# Patient Record
Sex: Female | Born: 1980 | Race: White | Hispanic: No | Marital: Married | Smoking: Never smoker
Health system: Southern US, Community
[De-identification: ages and names within clinical notes are randomized; demographics above are authoritative.]

## PROBLEM LIST (undated history)

## (undated) DIAGNOSIS — D649 Anemia, unspecified: Secondary | ICD-10-CM

## (undated) HISTORY — DX: Anemia, unspecified: D64.9

## (undated) HISTORY — PX: WISDOM TOOTH EXTRACTION: SHX21

---

## 2008-02-03 ENCOUNTER — Emergency Department: Payer: Self-pay | Admitting: Emergency Medicine

## 2009-08-31 LAB — CONVERTED CEMR LAB: Pap Smear: NORMAL

## 2009-11-11 ENCOUNTER — Ambulatory Visit: Payer: Self-pay | Admitting: Internal Medicine

## 2009-11-11 DIAGNOSIS — D509 Iron deficiency anemia, unspecified: Secondary | ICD-10-CM

## 2009-11-11 DIAGNOSIS — R002 Palpitations: Secondary | ICD-10-CM

## 2010-05-15 ENCOUNTER — Encounter: Admission: RE | Admit: 2010-05-15 | Discharge: 2010-05-15 | Payer: Self-pay | Admitting: Internal Medicine

## 2010-09-30 NOTE — Assessment & Plan Note (Signed)
Summary: NEW PT/BCBS/HEART MURMUR?/#/LB   Vital Signs:  Patient profile:   30 year old female Height:      61 inches Weight:      131.50 pounds BMI:     24.94 O2 Sat:      98 % on Room air Temp:     97.2 degrees F oral Pulse rate:   81 / minute BP sitting:   116 / 80  (left arm) Cuff size:   regular  Vitals Entered ByZella Ball Ewing (November 11, 2009 9:40 AM)  O2 Flow:  Room air  Preventive Care Screening  Pap Smear:    Date:  08/31/2009    Results:  normal      declines tetanus shot  CC: New Pt/RE   CC:  New Pt/RE.  History of Present Illness: here with "heart fluttering off and on"  more lately in the 3 months;;  Pt denies CP,, doe, wheezing, orthopnea, pnd, worsening LE edema,  dizziness or syncope, but ":take my breath away and startles me"   Symptoms for last for up to 2 seconds and seems hard, but more frequent lately, with more social stressors.  Denies signifcinat depression, suicdal ideaion, or panic or anxiety.  has caffeine once per wk  only - usually water.   No recentt thyroid or hgb . The past 3 days with 1 -3 times per day, random  .  no prior hx of tx,  on prior hx of diagnosis of pvc or other.  No previous heart issues or murmur.    Preventive Screening-Counseling & Management  Alcohol-Tobacco     Smoking Status: never      Drug Use:  no.    Problems Prior to Update: None  Medications Prior to Update: 1)  None  Current Medications (verified): 1)  Atenolol 25 Mg Tabs (Atenolol) .... 1/2 - 1 By Mouth Once Daily As Needed  Allergies (verified): No Known Drug Allergies  Past History:  Family History: Last updated: 11/11/2009 father with HTN, and elevated cholesterol, MI grandfather with MI/CAD grandfather with stroke, and HTN p-aunt and uncle with DM mother with thyroid surgury  Social History: Last updated: 11/11/2009 work - Sales executive Single no children Alcohol use-yes - social Never Smoked Drug use-no  Risk Factors: Smoking  Status: never (11/11/2009)  Past Medical History: Anemia-iron deficiency  Past Surgical History: Denies surgical history  Family History: Reviewed history and no changes required. father with HTN, and elevated cholesterol, MI grandfather with MI/CAD grandfather with stroke, and HTN p-aunt and uncle with DM mother with thyroid surgury  Social History: Reviewed history and no changes required. work - Sales executive Single no children Alcohol use-yes - social Never Smoked Drug use-no Smoking Status:  never Drug Use:  no  Review of Systems  The patient denies anorexia, fever, weight loss, weight gain, vision loss, decreased hearing, hoarseness, chest pain, syncope, dyspnea on exertion, peripheral edema, prolonged cough, headaches, hemoptysis, abdominal pain, melena, hematochezia, severe indigestion/heartburn, hematuria, muscle weakness, suspicious skin lesions, difficulty walking, depression, unusual weight change, abnormal bleeding, enlarged lymph nodes, and angioedema.         all otherwise negative per pt -    Physical Exam  General:  alert and well-developed.   Head:  normocephalic and atraumatic.   Eyes:  vision grossly intact, pupils equal, and pupils round.   Ears:  R ear normal and L ear normal.   Nose:  no external deformity and no nasal discharge.   Mouth:  no  gingival abnormalities and pharynx pink and moist.   Neck:  supple and no masses.   Lungs:  normal respiratory effort and normal breath sounds.   Heart:  normal rate, regular rhythm, no murmur, and no gallop.   Abdomen:  soft, non-tender, and normal bowel sounds.   Msk:  no joint tenderness and no joint swelling.   Extremities:  no edema, no erythema  Neurologic:  cranial nerves II-XII intact and strength normal in all extremities.   Skin:  color normal and no rashes.   Psych:  not depressed appearing and slightly anxious.     Impression & Recommendations:  Problem # 1:  Preventive Health Care  (ICD-V70.0)  Overall doing well, age appropriate education and counseling updated and referral for appropriate preventive services done unless declined, immunizations up to date or declined, diet counseling done if overweight, urged to quit smoking if smokes , most recent labs reviewed and current ordered if appropriate, ecg reviewed or declined (interpretation per ECG scanned in the EMR if done); information regarding Medicare Prevention requirements given if appropriate   Orders: TLB-BMP (Basic Metabolic Panel-BMET) (80048-METABOL) TLB-CBC Platelet - w/Differential (85025-CBCD) TLB-Hepatic/Liver Function Pnl (80076-HEPATIC) TLB-Lipid Panel (80061-LIPID) TLB-TSH (Thyroid Stimulating Hormone) (84443-TSH) TLB-Udip ONLY (81003-UDIP)  Problem # 2:  PALPITATIONS, OCCASIONAL (ICD-785.1)  prob stress related, consider holter but so infrequent this  is likely low yield;  ok for atenolol as needed, to check echo - r/o MVP, avoid caffeine and stress if possible  Orders: EKG w/ Interpretation (93000) Echo Referral (Echo)  Her updated medication list for this problem includes:    Atenolol 25 Mg Tabs (Atenolol) .Marland Kitchen... 1/2 - 1 by mouth once daily as needed  Complete Medication List: 1)  Atenolol 25 Mg Tabs (Atenolol) .... 1/2 - 1 by mouth once daily as needed  Patient Instructions: 1)  Please go to the Lab in the basement for your blood and/or urine tests today  2)  Pleaes call the number on the [facility] Card for results of your tests 3)  Your EKG was good today 4)  You will be contacted about the referral(s) to: echocardiogram 5)  We can consider the Holter Monitor if your symptoms persist or get worse 6)  Please take all new medications as prescribed as needed 7)  Please schedule a follow-up appointment as needed. Prescriptions: ATENOLOL 25 MG TABS (ATENOLOL) 1/2 - 1 by mouth once daily as needed  #30 x 11   Entered and Authorized by:   Corwin Levins MD   Signed by:   Corwin Levins MD on  11/11/2009   Method used:   Print then Give to Patient   RxID:   [id number]

## 2011-11-05 ENCOUNTER — Encounter: Payer: Self-pay | Admitting: *Deleted

## 2011-11-05 ENCOUNTER — Ambulatory Visit (INDEPENDENT_AMBULATORY_CARE_PROVIDER_SITE_OTHER): Payer: BC Managed Care – PPO | Admitting: Internal Medicine

## 2011-11-05 ENCOUNTER — Encounter: Payer: Self-pay | Admitting: Internal Medicine

## 2011-11-05 VITALS — BP 150/79 | HR 88 | Temp 98.4°F | Resp 16 | Ht 62.0 in | Wt 146.6 lb

## 2011-11-05 DIAGNOSIS — J069 Acute upper respiratory infection, unspecified: Secondary | ICD-10-CM

## 2011-11-05 MED ORDER — MOMETASONE FUROATE 50 MCG/ACT NA SUSP: 2.0000 | Freq: Every day | NASAL | Status: DC

## 2011-11-05 MED ORDER — PREDNISONE 20 MG PO TABS: ORAL_TABLET | ORAL | Status: DC

## 2011-11-05 NOTE — Progress Notes (Signed)
  Subjective:    Patient ID: Shelby Finley, female    DOB: 1981-05-23, 31 y.o.   MRN: [id number]  URI  This is a new problem. The current episode started in the past 7 days. The problem has been unchanged. There has been no fever. Associated symptoms include congestion and rhinorrhea. Pertinent negatives include no coughing, ear pain, nausea, neck pain, rash, swollen glands or wheezing. She has tried decongestant for the symptoms. The treatment provided no relief.   Siobhan has had rhinitis for 2 days, has difficulty breathing through her nose.  She had similar symptoms 2 weeks ago but it resolved completely.  No asthma or allergies in her background, she has never had PE tubes or recurrent sinus problems.  She is recently married, uses birth control, last period 10 days ago, she denies pregnancy.   Review of Systems  Constitutional: Negative.   HENT: Positive for congestion and rhinorrhea. Negative for ear pain, neck pain and postnasal drip.   Eyes: Negative.   Respiratory: Negative.  Negative for cough and wheezing.   Cardiovascular: Negative.   Gastrointestinal: Negative.  Negative for nausea.  Genitourinary: Negative.   Musculoskeletal: Negative.   Skin: Negative for rash.  Psychiatric/Behavioral: Negative.   All other systems reviewed and are negative.       Objective:   Physical Exam  Vitals reviewed. Constitutional: She is oriented to person, place, and time. She appears well-developed and well-nourished.  HENT:  Head: Normocephalic and atraumatic.  Left Ear: External ear normal.  Mouth/Throat: Oropharynx is clear and moist. No oropharyngeal exudate.       Right TM only partially observed secondary to cerumen  Eyes: Conjunctivae are normal.  Neck: Neck supple.  Cardiovascular: Normal rate, regular rhythm and normal heart sounds.   Pulmonary/Chest: Effort normal and breath sounds normal. She has no wheezes.  Abdominal: Soft.  Lymphadenopathy:    She has no cervical  adenopathy.  Neurological: She is alert and oriented to person, place, and time.  Skin: Skin is warm and dry.  Psychiatric: She has a normal mood and affect. Her behavior is normal.          Assessment & Plan:  URI:  She is given a short course of prednisone and Nasonex for her symptoms.  If her sinus discomfort increases she is to call me and I will call her in an antibiotic for sinusitis.  She agrees with this plan.  AVS printed.

## 2011-11-05 NOTE — Patient Instructions (Signed)

## 2012-10-17 ENCOUNTER — Ambulatory Visit (INDEPENDENT_AMBULATORY_CARE_PROVIDER_SITE_OTHER): Payer: BC Managed Care – PPO | Admitting: Internal Medicine

## 2012-10-17 VITALS — BP 121/71 | HR 91 | Temp 98.3°F | Resp 16 | Ht 62.0 in | Wt 143.4 lb

## 2012-10-17 DIAGNOSIS — H9201 Otalgia, right ear: Secondary | ICD-10-CM

## 2012-10-17 DIAGNOSIS — H9209 Otalgia, unspecified ear: Secondary | ICD-10-CM

## 2012-10-17 MED ORDER — NEOMYCIN-POLYMYXIN-HC 3.5-10000-1 OT SUSP: 3.0000 [drp] | Freq: Three times a day (TID) | OTIC | Status: DC

## 2012-10-17 NOTE — Progress Notes (Signed)
  Subjective:    Patient ID: Shelby Finley, female    DOB: 05/08/1981, 32 y.o.   MRN: [id number]  HPI healthy 32 year old with a two-day history of pain in the right ear No change in hearing Pain is sharp and intermittent No recent URI or cough No chronic ear problems    Review of Systems     Objective:   Physical Exam Vital signs stable Right canal with partial occlusion by cerumen Left canal clear Nares clear No regional adenopathy No tenderness with tragus pull  Right ear irrigated successfully Post irrigation the canal Canal is clear but mildly inflamed/no cellulitis/tympanic membrane clear       Assessment & Plan:  Problem #1 otalgia  Cortisporin Otic for 7 days

## 2013-01-17 ENCOUNTER — Ambulatory Visit: Payer: BC Managed Care – PPO

## 2013-01-17 ENCOUNTER — Ambulatory Visit (INDEPENDENT_AMBULATORY_CARE_PROVIDER_SITE_OTHER): Payer: BC Managed Care – PPO | Admitting: Family Medicine

## 2013-01-17 VITALS — BP 132/64 | HR 115 | Temp 98.5°F | Resp 16 | Ht 62.5 in | Wt 145.4 lb

## 2013-01-17 DIAGNOSIS — R079 Chest pain, unspecified: Secondary | ICD-10-CM

## 2013-01-17 DIAGNOSIS — R002 Palpitations: Secondary | ICD-10-CM

## 2013-01-17 LAB — COMPREHENSIVE METABOLIC PANEL
ALT: 16 U/L (ref 0–35)
AST: 18 U/L (ref 0–37)
Alkaline Phosphatase: 41 U/L (ref 39–117)
BUN: 12 mg/dL (ref 6–23)
Creat: 0.91 mg/dL (ref 0.50–1.10)
Potassium: 4.8 mEq/L (ref 3.5–5.3)

## 2013-01-17 LAB — POCT CBC
Granulocyte percent: 59.5 %G (ref 37–80)
MCH, POC: 29.8 pg (ref 27–31.2)
MCV: 95.9 fL (ref 80–97)
MID (cbc): 0.4 (ref 0–0.9)
MPV: 8.7 fL (ref 0–99.8)
POC LYMPH PERCENT: 33.2 %L (ref 10–50)
POC MID %: 7.3 %M (ref 0–12)
Platelet Count, POC: 290 10*3/uL (ref 142–424)
RBC: 4.29 M/uL (ref 4.04–5.48)
RDW, POC: 14.3 %

## 2013-01-17 LAB — LIPID PANEL
HDL: 77 mg/dL (ref >39)
LDL Cholesterol: 49 mg/dL (ref 0–99)
Triglycerides: 128 mg/dL (ref <150)
VLDL: 26 mg/dL (ref 0–40)

## 2013-01-17 NOTE — Progress Notes (Addendum)
Subjective:    Patient ID: Shelby Finley, female    DOB: May 30, 1981, 32 y.o.   MRN: [id number] Chief Complaint  Patient presents with  . Chest Pain    been having chest tightness and irregular heart rate for awhile    HPI  Has been having intermittent chest tightness for the past sev mos.  Sometimes chest tightness makes her feel like she needs to take a deep breath. Before would only have episodes about twice a year but within the past few mos more reg than last wk was daily - episodes consist of palpitations, sometimes tightness alone.  Palpitations occur only over seconds but tightness can last hours. No indigestion or heartburn no metallic saliva or water brash. Occ caffiene - very rare.   Drinking a lot water. Never checks pulse or BP. Used to get a lot of exercise but past few mos has quit - was taking pre-work-out supp and though that might be triggering sxs but stopped that w/o relief.  Palpitations and tightness not related to exertion Does not sleep very well - does not feel rested when she wakes up - gets about 7 hrs.   Got married 2 yrs ago High stress levels due to job but no anxiety or h/o panic attacks but work very constant.  The episodes of chest tightness and palpitations didn't happen when she was on vacations recently at all but just seem to be more at work.    No h/o asthma.  No meds or current supp other than birth ctonrol. Calves swell occ in but always equal in each leg and occ. Father sees Dr. Lorella Nimrod w/ [facility] so was hoping to get a cardiology eval with him but was told that she needed a referral. Lots of +FHx of CAD in father'd side - father MI in 27s, PGM in 85s, PGF MI - all w/ HTN MOther w/ thyroid and thyroid, and MGM w/ CVA and HTN - 61s  Works in a [employer] development officer.  Past Medical History  Diagnosis Date  . Anemia    History reviewed. No pertinent past surgical history. Current Outpatient Prescriptions on File Prior to Visit  Medication Sig  Dispense Refill  . norethindrone-ethinyl estradiol (JUNEL FE,GILDESS FE,LOESTRIN FE) 1-20 MG-MCG tablet Take 1 tablet by mouth daily.       No current facility-administered medications on file prior to visit.   No Known Allergies Family History  Problem Relation Age of Onset  . Heart disease Father   . Hypertension Father   . Hypertension Maternal Grandmother   . Stroke Maternal Grandmother   . Hypertension Maternal Grandfather   . Hypertension Paternal Grandmother   . Heart disease Paternal Grandmother    History   Social History  . Marital Status: Married    Spouse Name: N/A    Number of Children: N/A  . Years of Education: N/A   Social History Main Topics  . Smoking status: Never Smoker   . Smokeless tobacco: None  . Alcohol Use: Yes  . Drug Use: No  . Sexually Active: Yes    Birth Control/ Protection: Pill   Other Topics Concern  . None   Social History Narrative  . None    Review of Systems  Constitutional: Positive for fatigue. Negative for fever, chills, diaphoresis and appetite change.  HENT: Negative for sore throat, drooling, mouth sores, trouble swallowing and voice change.   Eyes: Negative for visual disturbance.  Respiratory: Positive for chest tightness. Negative for cough and  shortness of breath.   Cardiovascular: Positive for palpitations and leg swelling. Negative for chest pain.  Gastrointestinal: Negative for abdominal pain.  Genitourinary: Negative for decreased urine volume.  Neurological: Negative for syncope and headaches.  Hematological: Does not bruise/bleed easily.      BP 132/64  Pulse 115  Temp(Src) 98.5 F (36.9 C) (Oral)  Resp 16  Ht 5' 2.5" (1.588 m)  Wt 145 lb 6.4 oz (65.953 kg)  BMI 26.15 kg/m2  SpO2 97%  LMP 01/16/2013 - recheck pulse 80s Objective:   Physical Exam  Constitutional: She is oriented to person, place, and time. She appears well-developed and well-nourished. No distress.  HENT:  Head: Normocephalic and  atraumatic.  Right Ear: External ear normal.  Left Ear: External ear normal.  Eyes: Conjunctivae are normal. No scleral icterus.  Neck: Normal range of motion. Neck supple. No thyromegaly present.  Cardiovascular: Normal rate, regular rhythm, normal heart sounds and intact distal pulses.   Pulmonary/Chest: Effort normal and breath sounds normal. No respiratory distress.  Musculoskeletal: She exhibits no edema.  Lymphadenopathy:    She has no cervical adenopathy.  Neurological: She is alert and oriented to person, place, and time.  Skin: Skin is warm and dry. She is not diaphoretic. No erythema.  Psychiatric: She has a normal mood and affect. Her behavior is normal.      Results for orders placed in visit on 01/17/13  POCT CBC      Result Value Range   WBC 5.7  4.6 - 10.2 K/uL   Lymph, poc 1.9  0.6 - 3.4   POC LYMPH PERCENT 33.2  10 - 50 %L   MID (cbc) 0.4  0 - 0.9   POC MID % 7.3  0 - 12 %M   POC Granulocyte 3.4  2 - 6.9   Granulocyte percent 59.5  37 - 80 %G   RBC 4.29  4.04 - 5.48 M/uL   Hemoglobin 12.8  12.2 - 16.2 g/dL   HCT, POC 40.9  81.1 - 47.9 %   MCV 95.9  80 - 97 fL   MCH, POC 29.8  27 - 31.2 pg   MCHC 31.1 (*) 31.8 - 35.4 g/dL   RDW, POC 91.4     Platelet Count, POC 290  142 - 424 K/uL   MPV 8.7  0 - 99.8 fL      [facility] reading (PRIMARY) by  Dr. Clelia Croft. CXR: No abnormality. EKG: Pr int 112, NSR, no ischemic changes Assessment & Plan:  Chest pain, unspecified - Plan: EKG 12-Lead, POCT CBC, Comprehensive metabolic panel, TSH, Lipid panel, DG Chest 2 View  Palpitations - Will refer to cardiology for event monitor and further eval.  Shortened PR interval - likely benign but poss wpw abnml or other? - refer to cardiology

## 2013-01-17 NOTE — Patient Instructions (Addendum)
Palpitations  A palpitation is the feeling that your heartbeat is irregular or is faster than normal. It may feel like your heart is fluttering or skipping a beat. Palpitations are usually not a serious problem. However, in some cases, you may need further medical evaluation. CAUSES  Palpitations can be caused by:  Smoking.  Caffeine or other stimulants, such as diet pills or energy drinks.  Alcohol.  Stress and anxiety.  Strenuous physical activity.  Fatigue.  Certain medicines.  Heart disease, especially if you have a history of arrhythmias. This includes atrial fibrillation, atrial flutter, or supraventricular tachycardia.  An improperly working pacemaker or defibrillator. DIAGNOSIS  To find the cause of your palpitations, your caregiver will take your history and perform a physical exam. Tests may also be done, including:  Electrocardiography (ECG). This test records the heart's electrical activity.  Cardiac monitoring. This allows your caregiver to monitor your heart rate and rhythm in real time.  Holter monitor. This is a portable device that records your heartbeat and can help diagnose heart arrhythmias. It allows your caregiver to track your heart activity for several days, if needed.  Stress tests by exercise or by giving medicine that makes the heart beat faster. TREATMENT  Treatment of palpitations depends on the cause of your symptoms and can vary greatly. Most cases of palpitations do not require any treatment other than time, relaxation, and monitoring your symptoms. Other causes, such as atrial fibrillation, atrial flutter, or supraventricular tachycardia, usually require further treatment. HOME CARE INSTRUCTIONS   Avoid:  Caffeinated coffee, tea, soft drinks, diet pills, and energy drinks.  Chocolate.  Alcohol.  Stop smoking if you smoke.  Reduce your stress and anxiety. Things that can help you relax include:  A method that measures bodily functions so  you can learn to control them (biofeedback).  Yoga.  Meditation.  Physical activity such as swimming, jogging, or walking.  Get plenty of rest and sleep. SEEK MEDICAL CARE IF:   You continue to have a fast or irregular heartbeat beyond 24 hours.  Your palpitations occur more often. SEEK IMMEDIATE MEDICAL CARE IF:  You develop chest pain or shortness of breath.  You have a severe headache.  You feel dizzy, or you faint. MAKE SURE YOU:  Understand these instructions.  Will watch your condition.  Will get help right away if you are not doing well or get worse. Document Released: 08/14/2000 Document Revised: 02/16/2012 Document Reviewed: 10/16/2011 Exit[employer] Patient Information 2013 [employer].  

## 2013-06-12 ENCOUNTER — Ambulatory Visit (INDEPENDENT_AMBULATORY_CARE_PROVIDER_SITE_OTHER): Payer: BC Managed Care – PPO | Admitting: Family Medicine

## 2013-06-12 VITALS — BP 108/70 | HR 109 | Temp 98.6°F | Resp 18 | Ht 62.5 in | Wt 148.0 lb

## 2013-06-12 DIAGNOSIS — J329 Chronic sinusitis, unspecified: Secondary | ICD-10-CM

## 2013-06-12 MED ORDER — AMOXICILLIN 875 MG PO TABS: 875.0000 mg | ORAL_TABLET | Freq: Two times a day (BID) | ORAL | Status: DC

## 2013-06-12 NOTE — Progress Notes (Signed)
Subjective:   Patient ID: Shelby Finley, female    DOB: 05/28/1981, 32 y.o.   MRN: [id number] HPI Review of Systems Objective:   Physical Exam Assessment & Plan:    @[facility] @  Patient ID: Shelby Finley MRN: [id number], DOB: Jul 13, 1981, 32 y.o. Date of Encounter: 06/12/2013, 8:09 AM This chart was scribed for Elvina Sidle, MD by Valera Castle, ED Scribe. This patient was seen in room 3 and the patient's care was started at 8:09.   Primary Physician: Zelphia Cairo, MD  Chief Complaint: Sore throat, Congestion  HPI: 32 y.o. year old female with history below presents with a sudden, moderate sore throat and congestion, onset 3 days ago. She reports that she has not taken anything over the counter. She states that she has tried Mucinex and cough drops, with no relief. She states that the symptoms started with a sore throat and a stiff neck, and progressed to congestion the following day. She reports that she has had an intermittent, unproductive cough that has led to her losing her voice. She denies any known allergies.  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    Past Medical History  Diagnosis Date  . Anemia      Home Meds: Prior to Admission medications   Medication Sig Start Date End Date Taking? Authorizing Provider  norethindrone-ethinyl estradiol (JUNEL FE,GILDESS FE,LOESTRIN FE) 1-20 MG-MCG tablet Take 1 tablet by mouth daily.   Yes Historical Provider, MD    Allergies: No Known Allergies  History   Social History  . Marital Status: Married    Spouse Name: N/A    Number of Children: N/A  . Years of Education: N/A   Occupational History  . Not on file.   Social History Main Topics  . Smoking status: Never Smoker   . Smokeless tobacco: Not on file  . Alcohol Use: Yes  . Drug Use: No  . Sexual Activity: Yes    Birth Control/ Protection: Pill   Other Topics Concern  . Not on file   Social History  Narrative  . No narrative on file     Review of Systems: Constitutional: negative for chills, fever, night sweats, weight changes, or fatigue  HEENT: negative for vision changes, hearing loss, rhinorrhea, ST, epistaxis Positive for congestion, sore throat Cardiovascular: negative for chest pain or palpitations Respiratory: negative for hemoptysis, wheezing, shortness of breath Positive for unproductive cough  Abdominal: negative for abdominal pain, nausea, vomiting, diarrhea, or constipation Dermatological: negative for rash Musc/Skeletal: Positive for neck stiffness Neurologic: negative for headache, dizziness, or syncope All other systems reviewed and are otherwise negative with the exception to those above and in the HPI.   Physical Exam: Blood pressure 108/70, pulse 109, temperature 98.6 F (37 C), temperature source Oral, resp. rate 18, height 5' 2.5" (1.588 m), weight 148 lb (67.132 kg), last menstrual period 05/29/2013, SpO2 98.00%., Body mass index is 26.62 kg/(m^2). General: Well developed, well nourished, in no acute distress. Head: Normocephalic, atraumatic, eyes without discharge, sclera non-icteric, nares are without discharge. Bilateral auditory canals clear, TM's are without perforation, pearly grey and translucent with reflective cone of light bilaterally. Oral cavity moist, posterior pharynx without exudate, erythema, peritonsillar abscess, or post nasal drip.  Bilateral mucopurulent d/c nasal passages.  Husky voice  Neck: Supple. No thyromegaly. Full ROM. No lymphadenopathy. Lungs: Clear bilaterally to auscultation without wheezes, rales, or rhonchi. Breathing is unlabored. Heart: RRR with S1 S2. No  murmurs, rubs, or gallops appreciated. Abdomen: Soft, non-tender, non-distended with normoactive bowel sounds. No hepatomegaly. No rebound/guarding. No obvious abdominal masses. Msk:  Strength and tone normal for age. Extremities/Skin: Warm and dry. No clubbing or cyanosis.  No edema. No rashes or suspicious lesions. Neuro: Alert and oriented X 3. Moves all extremities spontaneously. Gait is normal. CNII-XII grossly in tact. Psych:  Responds to questions appropriately with a normal affect.    ASSESSMENT AND PLAN:  32 y.o. year old female with Sinusitis - Plan: amoxicillin (AMOXIL) 875 MG tablet, DISCONTINUED: amoxicillin (AMOXIL) 875 MG tablet     Signed, Elvina Sidle, MD 06/12/2013 8:09 AM

## 2013-06-12 NOTE — Patient Instructions (Signed)

## 2013-07-06 ENCOUNTER — Other Ambulatory Visit: Payer: Self-pay

## 2014-02-07 ENCOUNTER — Ambulatory Visit (INDEPENDENT_AMBULATORY_CARE_PROVIDER_SITE_OTHER): Payer: BC Managed Care – PPO | Admitting: Family Medicine

## 2014-02-07 VITALS — BP 132/74 | HR 83 | Temp 97.8°F | Resp 16 | Ht 61.0 in | Wt 151.6 lb

## 2014-02-07 DIAGNOSIS — J069 Acute upper respiratory infection, unspecified: Secondary | ICD-10-CM

## 2014-02-07 NOTE — Patient Instructions (Signed)
Use Afrin nasal spray for up to 3 days Take benadryl at bed time as needed for excessive drainage Resume flonase- use twice a day for one week then once a day for a month Ibuprofen for sore throat and salt water gargles as needed.

## 2014-02-07 NOTE — Progress Notes (Signed)
   Subjective:    Patient ID: Shelby Finley, female    DOB: 11-20-1980, 33 y.o.   MRN: [id number]  HPI This is a pleasant 33 yo female who presents today with 2 day history of nasal congestion and runny nose. She reports copious amounts of clear nasal drainage. She has been taking sudafed several times a day without relief. Has felt like she has had this problem off and on for several months. She does not believe she has seasonal allergies. She has some flonase at home that she purchased on the recommendation of her pharmacist. She used it for 3 days and found it not to be effective.   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  Review of Systems No ear pain, no fever, no cough, +sore throat, no CP, no SOB.    Objective:   Physical Exam  Vitals reviewed. Constitutional: She is oriented to person, place, and time. She appears well-developed and well-nourished.  HENT:  Head: Normocephalic and atraumatic.  Right Ear: Tympanic membrane, external ear and ear canal normal.  Left Ear: Tympanic membrane, external ear and ear canal normal.  Nose: Mucosal edema and rhinorrhea present. Right sinus exhibits no maxillary sinus tenderness and no frontal sinus tenderness. Left sinus exhibits no maxillary sinus tenderness and no frontal sinus tenderness.  Mouth/Throat: Oropharynx is clear and moist and mucous membranes are normal.  Eyes: Conjunctivae are normal. Right eye exhibits no discharge. Left eye exhibits no discharge. No scleral icterus.  Neck: Normal range of motion. Neck supple.  Cardiovascular: Normal rate, regular rhythm and normal heart sounds.   Pulmonary/Chest: Effort normal and breath sounds normal.  Musculoskeletal: Normal range of motion.  Lymphadenopathy:    She has no cervical adenopathy.  Neurological: She is alert and oriented to person, place, and time.  Skin: Skin is warm and dry.    Psychiatric: She has a normal mood and affect. Her behavior is normal. Judgment and thought content normal.      Assessment & Plan:  1. Upper respiratory virus -This could be viral syndrome or early allergic rhinitis -Discussed diagnosis with patient and symptomatic treatment.  -Provided written and verbal instructions- Patient Instructions  Use Afrin nasal spray for up to 3 days Take benadryl at bed time as needed for excessive drainage Resume flonase- use twice a day for one week then once a day for a month Ibuprofen for sore throat and salt water gargles as needed.    RTC if worsening symptoms, no improvement in 5-7 days, fever, persistent purulent or bloody nasal drainage, severe headache.   Emi Belfast, FNP-BC  [facility]  02/07/2014 7:02 PM

## 2014-02-12 ENCOUNTER — Telehealth: Payer: Self-pay

## 2014-02-12 MED ORDER — AMOXICILLIN-POT CLAVULANATE 875-125 MG PO TABS: 1.0000 | ORAL_TABLET | Freq: Two times a day (BID) | ORAL | Status: DC

## 2014-02-12 NOTE — Telephone Encounter (Signed)
PT WAS TOLD TO CALL BACK IF SYMPTOMS WEREN'T GETTING BETTER, SHE IS NOW BLOWING UP GREEN STUFF, WOULD LIKE TO HAVE AN ANTIBIOTICS CALLED IN FOR HER PLEASE CALL PT AT [phone]    [facility] ON [address]

## 2014-02-12 NOTE — Telephone Encounter (Signed)
Meds ordered this encounter  Medications  . amoxicillin-clavulanate (AUGMENTIN) 875-125 MG per tablet    Sig: Take 1 tablet by mouth 2 (two) times daily.    Dispense:  20 tablet    Refill:  0    Order Specific Question:  Supervising Provider    Answer:  Tonye PearsonOLITTLE, ROBERT P [[id number]]   Patient notified by My Chart.

## 2014-02-12 NOTE — Telephone Encounter (Signed)
Patient states her cough has turned into wheezing and she is blowing green mucous out of her nose.  This is day 8 of her illness. Patient is requesting an antibiotic.

## 2014-04-12 ENCOUNTER — Ambulatory Visit (INDEPENDENT_AMBULATORY_CARE_PROVIDER_SITE_OTHER): Payer: BC Managed Care – PPO | Admitting: Emergency Medicine

## 2014-04-12 VITALS — BP 124/70 | HR 83 | Temp 98.3°F | Resp 16 | Ht 61.5 in | Wt 152.8 lb

## 2014-04-12 DIAGNOSIS — H6120 Impacted cerumen, unspecified ear: Secondary | ICD-10-CM

## 2014-04-12 DIAGNOSIS — H6122 Impacted cerumen, left ear: Secondary | ICD-10-CM

## 2014-04-12 NOTE — Progress Notes (Signed)
   Subjective:    Patient ID: Shelby Finley, female    DOB: [birth year], 33 y.o.   MRN: [id number]  HPI  33 year old caucasian female presents with "clogged up" left ear that began with pain in the ear yesterday and pt feels it became "clogged" this am.  No other symptoms.   Pt admits to 50/50 hearing since onset.     Review of Systems     Objective:   Physical Exam the right TM is normal the left ear canal is occluded with wax. The nose is normal. Throat normal to        Assessment & Plan:  Patient has a wax impaction of the left external auditory canal. This will be treated with irrigation.

## 2014-07-18 IMAGING — CR DG CHEST 2V
2 series · 2 of 2 positions shown · non-contrast
Comparison: None.

CLINICAL DATA: Chest pain.  Tightness in chest

CHEST - 2 VIEW

[PA]
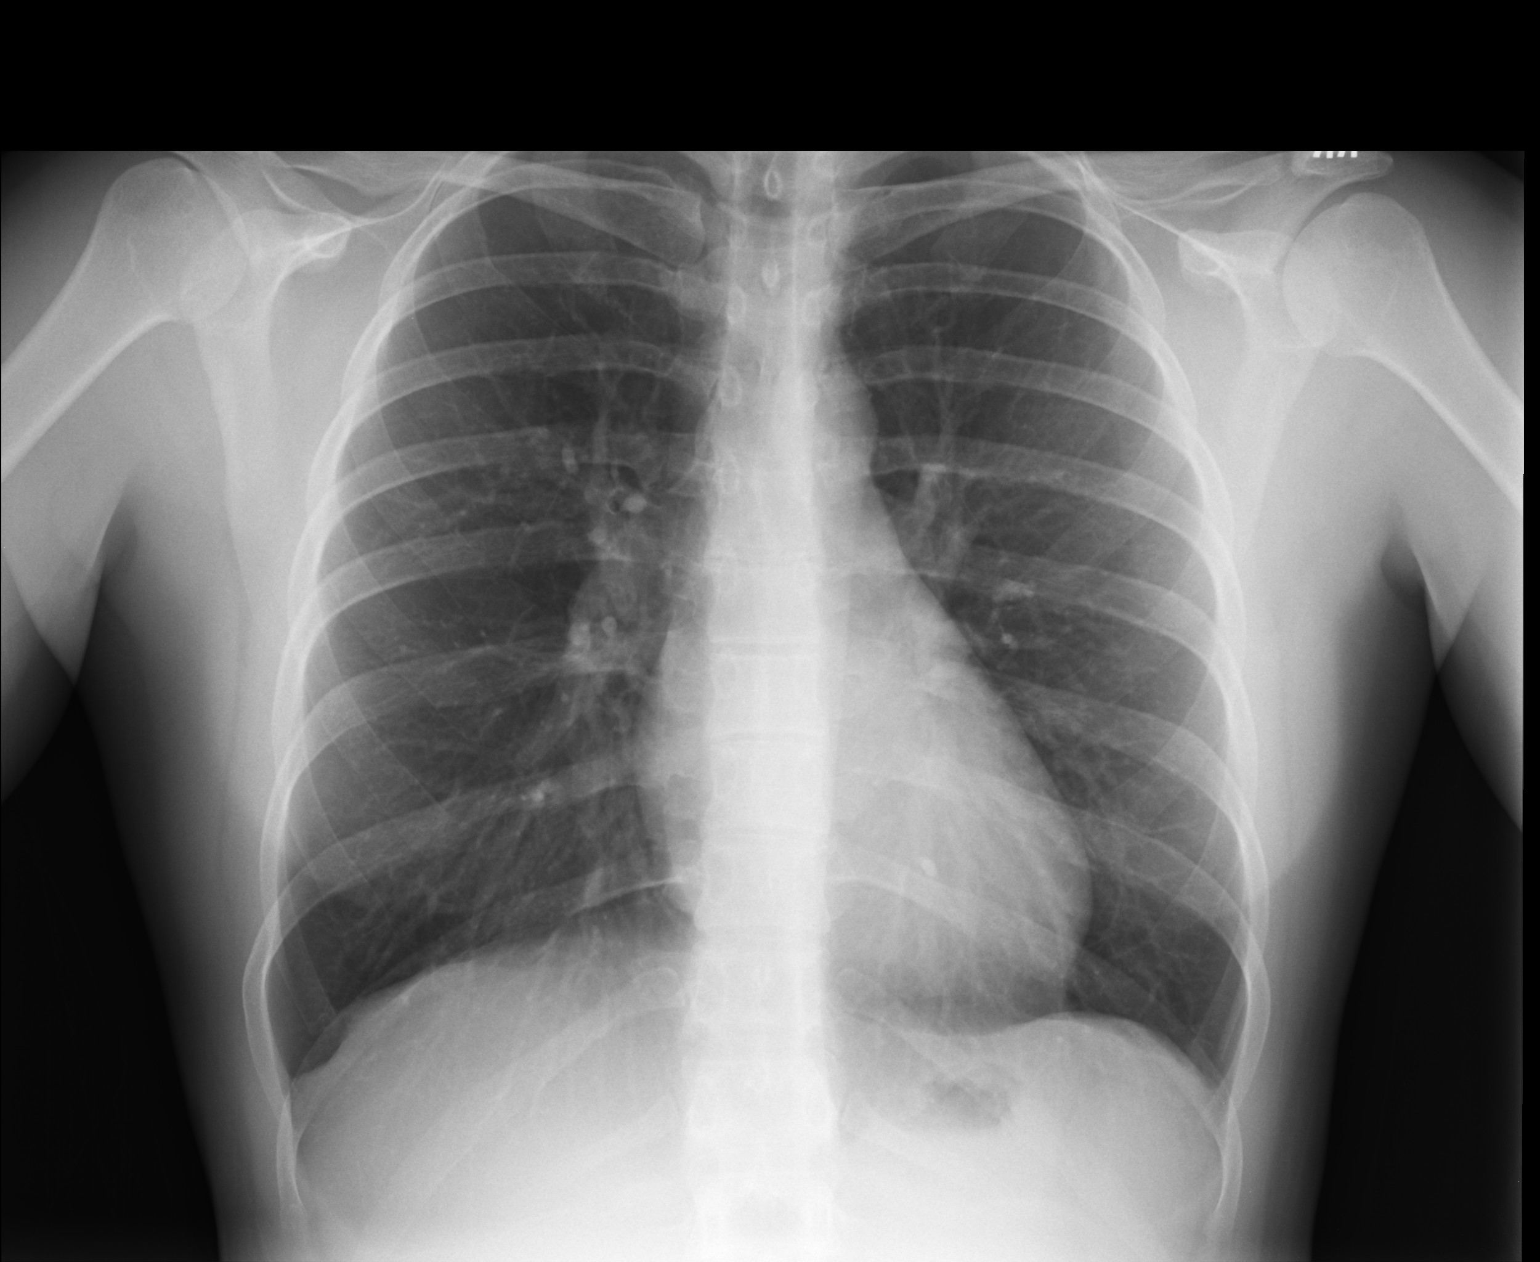

[lateral]
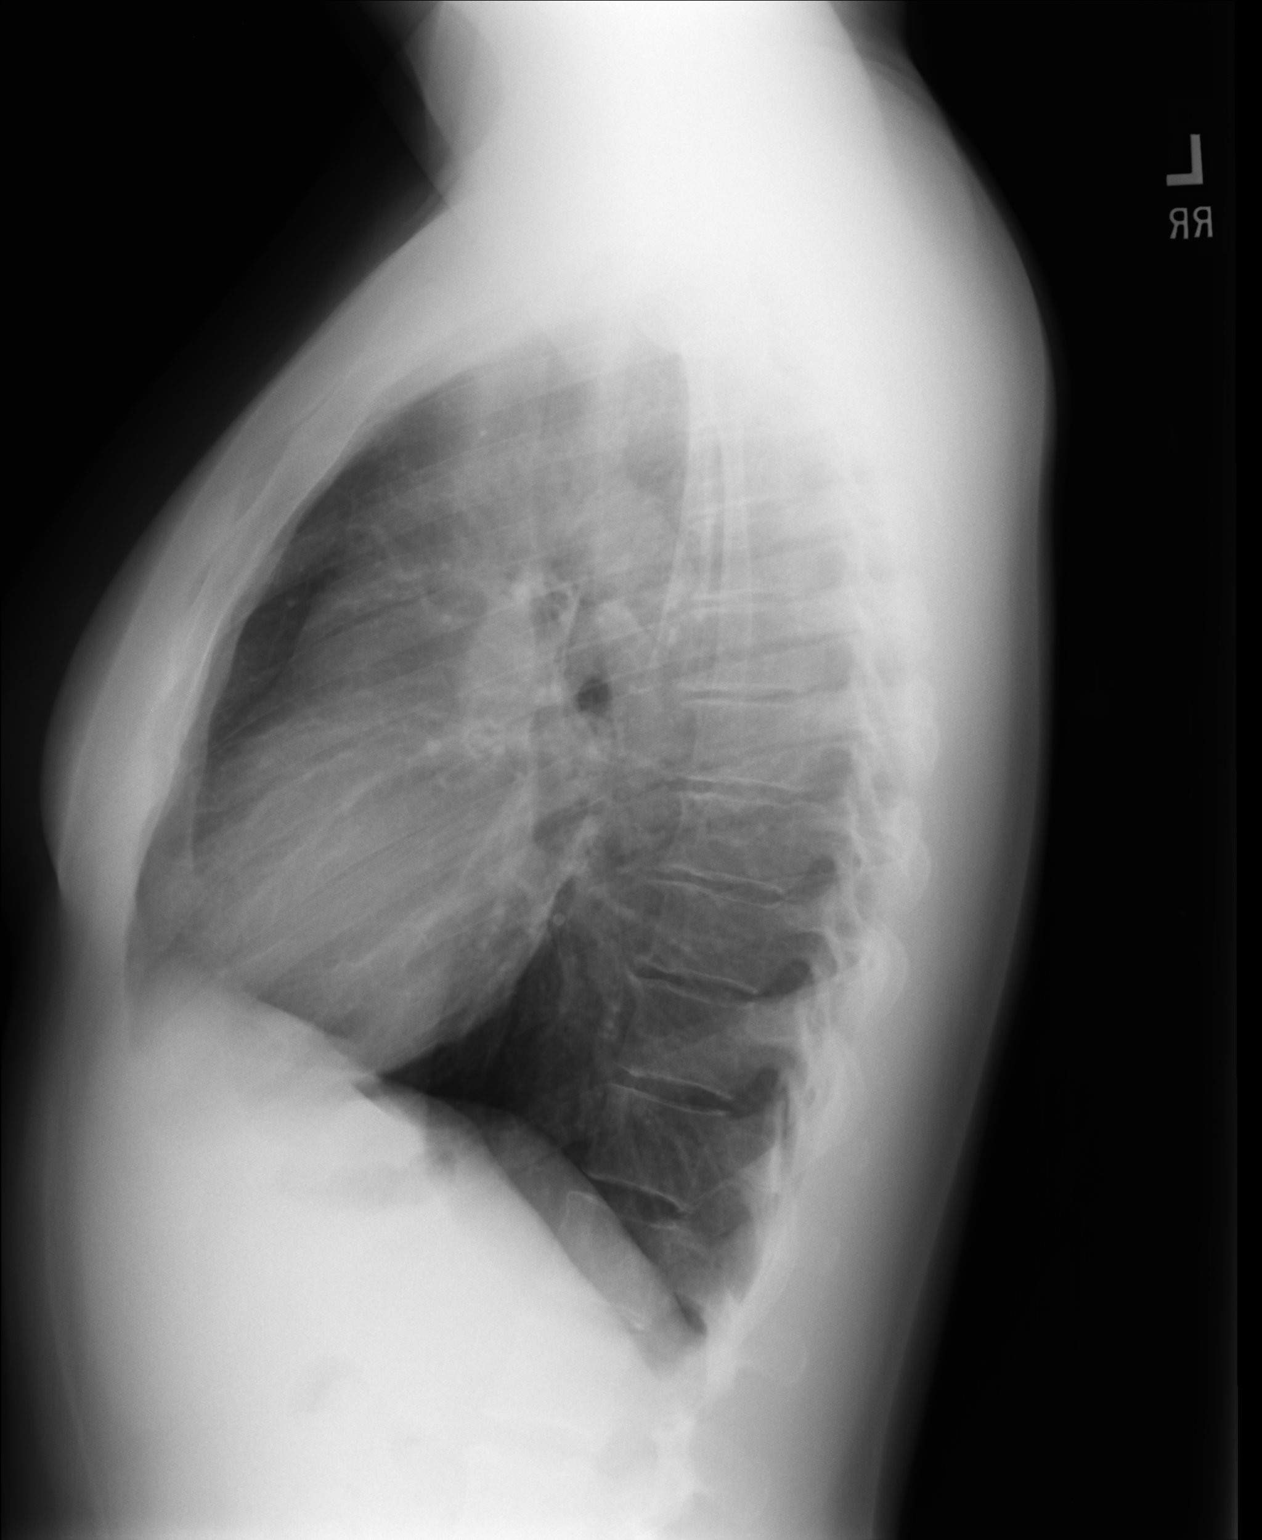

[2 of 2 positions shown; findings below may reference images not displayed]

FINDINGS: The heart size appears within normal limits.  There is no
pleural effusion or edema identified.  There is no airspace
consolidation identified.
IMPRESSION: 1.  No acute cardiopulmonary abnormalities.

## 2014-09-28 LAB — OB RESULTS CONSOLE HIV ANTIBODY (ROUTINE TESTING): HIV: NONREACTIVE

## 2014-09-28 LAB — OB RESULTS CONSOLE RUBELLA ANTIBODY, IGM: RUBELLA: IMMUNE

## 2014-09-28 LAB — OB RESULTS CONSOLE HEPATITIS B SURFACE ANTIGEN: Hepatitis B Surface Ag: NEGATIVE

## 2014-11-02 ENCOUNTER — Inpatient Hospital Stay (HOSPITAL_COMMUNITY): Admission: AD | Admit: 2014-11-02 | Payer: Self-pay | Source: Ambulatory Visit | Admitting: Obstetrics and Gynecology

## 2015-04-16 ENCOUNTER — Inpatient Hospital Stay (HOSPITAL_COMMUNITY): Payer: BLUE CROSS/BLUE SHIELD | Admitting: Anesthesiology

## 2015-04-16 ENCOUNTER — Encounter (HOSPITAL_COMMUNITY)
Admission: AD | Disposition: A | Discharge status: Home / Self Care | Payer: Self-pay | Source: Ambulatory Visit | Attending: Obstetrics and Gynecology

## 2015-04-16 ENCOUNTER — Encounter (HOSPITAL_COMMUNITY): Payer: Self-pay | Admitting: *Deleted

## 2015-04-16 ENCOUNTER — Inpatient Hospital Stay (HOSPITAL_COMMUNITY)
Admission: AD | Admit: 2015-04-16 | Discharge: 2015-04-19 | DRG: 766 | Disposition: A | Discharge status: Home / Self Care | Payer: BLUE CROSS/BLUE SHIELD | Source: Ambulatory Visit | Attending: Obstetrics and Gynecology | Admitting: Obstetrics and Gynecology

## 2015-04-16 DIAGNOSIS — Z8249 Family history of ischemic heart disease and other diseases of the circulatory system: Secondary | ICD-10-CM | POA: Diagnosis not present

## 2015-04-16 DIAGNOSIS — O321XX0 Maternal care for breech presentation, not applicable or unspecified: Secondary | ICD-10-CM | POA: Diagnosis present

## 2015-04-16 DIAGNOSIS — Z3490 Encounter for supervision of normal pregnancy, unspecified, unspecified trimester: Secondary | ICD-10-CM

## 2015-04-16 DIAGNOSIS — Z3A37 37 weeks gestation of pregnancy: Secondary | ICD-10-CM | POA: Diagnosis present

## 2015-04-16 DIAGNOSIS — Z823 Family history of stroke: Secondary | ICD-10-CM

## 2015-04-16 LAB — TYPE AND SCREEN
ABO/RH(D): A POS
Antibody Screen: NEGATIVE

## 2015-04-16 LAB — ABO/RH: ABO/RH(D): A POS

## 2015-04-16 LAB — CBC
HCT: 37.2 % (ref 36.0–46.0)
Hemoglobin: 12.3 g/dL (ref 12.0–15.0)
MCH: 29.6 pg (ref 26.0–34.0)
MCHC: 33.1 g/dL (ref 30.0–36.0)
MCV: 89.4 fL (ref 78.0–100.0)
PLATELETS: 293 10*3/uL (ref 150–400)
RBC: 4.16 MIL/uL (ref 3.87–5.11)
RDW: 13.6 % (ref 11.5–15.5)
WBC: 13.7 10*3/uL — ABNORMAL HIGH (ref 4.0–10.5)

## 2015-04-16 SURGERY — Surgical Case
Anesthesia: Spinal

## 2015-04-16 MED ORDER — FAMOTIDINE IN NACL 20-0.9 MG/50ML-% IV SOLN
20.0000 mg | Freq: Once | INTRAVENOUS | Status: AC
  Administered 2015-04-16: 20 mg via INTRAVENOUS

## 2015-04-16 MED ORDER — PHENYLEPHRINE 8 MG IN D5W 100 ML (0.08MG/ML) PREMIX OPTIME
INJECTION | INTRAVENOUS | Status: AC
  Filled 2015-04-16: qty 100

## 2015-04-16 MED ORDER — ACETAMINOPHEN 500 MG PO TABS
1000.0000 mg | ORAL_TABLET | Freq: Four times a day (QID) | ORAL | Status: AC
  Administered 2015-04-17: 1000 mg via ORAL
  Filled 2015-04-16: qty 2

## 2015-04-16 MED ORDER — LACTATED RINGERS IV SOLN
40.0000 [IU] | INTRAVENOUS | Status: DC | PRN
  Administered 2015-04-16: 40 [IU] via INTRAVENOUS

## 2015-04-16 MED ORDER — LACTATED RINGERS IV SOLN: INTRAVENOUS | Status: DC

## 2015-04-16 MED ORDER — ONDANSETRON HCL 4 MG/2ML IJ SOLN: 4.0000 mg | Freq: Three times a day (TID) | INTRAMUSCULAR | Status: DC | PRN

## 2015-04-16 MED ORDER — SENNOSIDES-DOCUSATE SODIUM 8.6-50 MG PO TABS
2.0000 | ORAL_TABLET | ORAL | Status: DC
  Administered 2015-04-17 – 2015-04-18 (×3): 2 via ORAL
  Filled 2015-04-16 (×3): qty 2

## 2015-04-16 MED ORDER — CEFAZOLIN SODIUM-DEXTROSE 2-3 GM-% IV SOLR
2.0000 g | INTRAVENOUS | Status: AC
  Administered 2015-04-16: 2 g via INTRAVENOUS
  Filled 2015-04-16: qty 50

## 2015-04-16 MED ORDER — MENTHOL 3 MG MT LOZG: 1.0000 | LOZENGE | OROMUCOSAL | Status: DC | PRN

## 2015-04-16 MED ORDER — DIPHENHYDRAMINE HCL 25 MG PO CAPS: 25.0000 mg | ORAL_CAPSULE | ORAL | Status: DC | PRN

## 2015-04-16 MED ORDER — CITRIC ACID-SODIUM CITRATE 334-500 MG/5ML PO SOLN
30.0000 mL | Freq: Once | ORAL | Status: AC
  Administered 2015-04-16: 30 mL via ORAL

## 2015-04-16 MED ORDER — OXYCODONE-ACETAMINOPHEN 5-325 MG PO TABS: 2.0000 | ORAL_TABLET | ORAL | Status: DC | PRN

## 2015-04-16 MED ORDER — LACTATED RINGERS IV SOLN
INTRAVENOUS | Status: DC | PRN
  Administered 2015-04-16: 16:00:00 via INTRAVENOUS

## 2015-04-16 MED ORDER — FENTANYL CITRATE (PF) 100 MCG/2ML IJ SOLN
INTRAMUSCULAR | Status: AC
  Filled 2015-04-16: qty 4

## 2015-04-16 MED ORDER — SCOPOLAMINE 1 MG/3DAYS TD PT72: 1.0000 | MEDICATED_PATCH | Freq: Once | TRANSDERMAL | Status: DC

## 2015-04-16 MED ORDER — OXYTOCIN 10 UNIT/ML IJ SOLN
INTRAMUSCULAR | Status: AC
  Filled 2015-04-16: qty 4

## 2015-04-16 MED ORDER — IBUPROFEN 600 MG PO TABS
600.0000 mg | ORAL_TABLET | Freq: Four times a day (QID) | ORAL | Status: DC
  Administered 2015-04-17 – 2015-04-19 (×10): 600 mg via ORAL
  Filled 2015-04-16 (×10): qty 1

## 2015-04-16 MED ORDER — FAMOTIDINE IN NACL 20-0.9 MG/50ML-% IV SOLN
INTRAVENOUS | Status: AC
  Filled 2015-04-16: qty 50

## 2015-04-16 MED ORDER — TETANUS-DIPHTH-ACELL PERTUSSIS 5-2.5-18.5 LF-MCG/0.5 IM SUSP: 0.5000 mL | Freq: Once | INTRAMUSCULAR | Status: DC

## 2015-04-16 MED ORDER — ONDANSETRON HCL 4 MG/2ML IJ SOLN
INTRAMUSCULAR | Status: AC
  Filled 2015-04-16: qty 2

## 2015-04-16 MED ORDER — MORPHINE SULFATE 0.5 MG/ML IJ SOLN
INTRAMUSCULAR | Status: AC
  Filled 2015-04-16: qty 100

## 2015-04-16 MED ORDER — NALBUPHINE HCL 10 MG/ML IJ SOLN: 5.0000 mg | Freq: Once | INTRAMUSCULAR | Status: DC | PRN

## 2015-04-16 MED ORDER — LACTATED RINGERS IV SOLN
INTRAVENOUS | Status: DC
  Administered 2015-04-16 (×3): via INTRAVENOUS

## 2015-04-16 MED ORDER — DIPHENHYDRAMINE HCL 25 MG PO CAPS: 25.0000 mg | ORAL_CAPSULE | Freq: Four times a day (QID) | ORAL | Status: DC | PRN

## 2015-04-16 MED ORDER — MORPHINE SULFATE (PF) 0.5 MG/ML IJ SOLN
INTRAMUSCULAR | Status: DC | PRN
  Administered 2015-04-16: .2 mg via EPIDURAL

## 2015-04-16 MED ORDER — DIBUCAINE 1 % RE OINT: 1.0000 "application " | TOPICAL_OINTMENT | RECTAL | Status: DC | PRN

## 2015-04-16 MED ORDER — SIMETHICONE 80 MG PO CHEW
80.0000 mg | CHEWABLE_TABLET | ORAL | Status: DC
Start: 2015-04-17 — End: 2015-04-19
  Administered 2015-04-17 – 2015-04-18 (×3): 80 mg via ORAL
  Filled 2015-04-16 (×3): qty 1

## 2015-04-16 MED ORDER — ZOLPIDEM TARTRATE 5 MG PO TABS: 5.0000 mg | ORAL_TABLET | Freq: Every evening | ORAL | Status: DC | PRN

## 2015-04-16 MED ORDER — BUPIVACAINE IN DEXTROSE 0.75-8.25 % IT SOLN
INTRATHECAL | Status: DC | PRN
  Administered 2015-04-16: 1.2 mL via INTRATHECAL

## 2015-04-16 MED ORDER — PRENATAL MULTIVITAMIN CH
1.0000 | ORAL_TABLET | Freq: Every day | ORAL | Status: DC
  Administered 2015-04-17 – 2015-04-18 (×2): 1 via ORAL
  Filled 2015-04-16 (×2): qty 1

## 2015-04-16 MED ORDER — NALBUPHINE HCL 10 MG/ML IJ SOLN: 5.0000 mg | INTRAMUSCULAR | Status: DC | PRN

## 2015-04-16 MED ORDER — WITCH HAZEL-GLYCERIN EX PADS: 1.0000 "application " | MEDICATED_PAD | CUTANEOUS | Status: DC | PRN

## 2015-04-16 MED ORDER — SIMETHICONE 80 MG PO CHEW
80.0000 mg | CHEWABLE_TABLET | Freq: Three times a day (TID) | ORAL | Status: DC
  Administered 2015-04-17 – 2015-04-18 (×5): 80 mg via ORAL
  Filled 2015-04-16 (×5): qty 1

## 2015-04-16 MED ORDER — NALOXONE HCL 0.4 MG/ML IJ SOLN: 0.4000 mg | INTRAMUSCULAR | Status: DC | PRN

## 2015-04-16 MED ORDER — OXYTOCIN 40 UNITS IN LACTATED RINGERS INFUSION - SIMPLE MED: 62.5000 mL/h | INTRAVENOUS | Status: AC

## 2015-04-16 MED ORDER — FENTANYL CITRATE (PF) 100 MCG/2ML IJ SOLN
INTRAMUSCULAR | Status: DC | PRN
  Administered 2015-04-16: 20 ug via INTRAVENOUS

## 2015-04-16 MED ORDER — ACETAMINOPHEN 325 MG PO TABS: 650.0000 mg | ORAL_TABLET | ORAL | Status: DC | PRN

## 2015-04-16 MED ORDER — NALOXONE HCL 1 MG/ML IJ SOLN: 1.0000 ug/kg/h | INTRAVENOUS | Status: DC | PRN

## 2015-04-16 MED ORDER — SODIUM CHLORIDE 0.9 % IJ SOLN: 3.0000 mL | INTRAMUSCULAR | Status: DC | PRN

## 2015-04-16 MED ORDER — DIPHENHYDRAMINE HCL 50 MG/ML IJ SOLN: 12.5000 mg | INTRAMUSCULAR | Status: DC | PRN

## 2015-04-16 MED ORDER — LANOLIN HYDROUS EX OINT: 1.0000 "application " | TOPICAL_OINTMENT | CUTANEOUS | Status: DC | PRN

## 2015-04-16 MED ORDER — ONDANSETRON HCL 4 MG/2ML IJ SOLN
INTRAMUSCULAR | Status: DC | PRN
  Administered 2015-04-16: 4 mg via INTRAVENOUS

## 2015-04-16 MED ORDER — PHENYLEPHRINE 8 MG IN D5W 100 ML (0.08MG/ML) PREMIX OPTIME
INJECTION | INTRAVENOUS | Status: DC | PRN
  Administered 2015-04-16: 60 ug/min via INTRAVENOUS

## 2015-04-16 MED ORDER — SIMETHICONE 80 MG PO CHEW: 80.0000 mg | CHEWABLE_TABLET | ORAL | Status: DC | PRN

## 2015-04-16 MED ORDER — CITRIC ACID-SODIUM CITRATE 334-500 MG/5ML PO SOLN
ORAL | Status: AC
  Filled 2015-04-16: qty 15

## 2015-04-16 MED ORDER — OXYCODONE-ACETAMINOPHEN 5-325 MG PO TABS
1.0000 | ORAL_TABLET | ORAL | Status: DC | PRN
  Administered 2015-04-18 – 2015-04-19 (×4): 1 via ORAL
  Filled 2015-04-16 (×4): qty 1

## 2015-04-16 MED ORDER — SODIUM CHLORIDE 0.9 % IJ SOLN
INTRAMUSCULAR | Status: AC
  Filled 2015-04-16: qty 10

## 2015-04-16 MED ORDER — SODIUM CHLORIDE 0.9 % IR SOLN
Status: DC | PRN
  Administered 2015-04-16: 1000 mL

## 2015-04-16 SURGICAL SUPPLY — 37 items
BENZOIN TINCTURE PRP APPL 2/3 (GAUZE/BANDAGES/DRESSINGS) ×3 IMPLANT
BULB HALOGEN 150 24V (BULBS & LAMPS) ×3 IMPLANT
CLAMP CORD UMBIL (MISCELLANEOUS) IMPLANT
CLOSURE WOUND 1/2 X4 (GAUZE/BANDAGES/DRESSINGS) ×1
CLOTH BEACON ORANGE TIMEOUT ST (SAFETY) ×3 IMPLANT
DRAPE SHEET LG 3/4 BI-LAMINATE (DRAPES) IMPLANT
DRSG OPSITE POSTOP 4X10 (GAUZE/BANDAGES/DRESSINGS) ×3 IMPLANT
DURAPREP 26ML APPLICATOR (WOUND CARE) ×3 IMPLANT
ELECT REM PT RETURN 9FT ADLT (ELECTROSURGICAL) ×3
ELECTRODE REM PT RTRN 9FT ADLT (ELECTROSURGICAL) ×1 IMPLANT
EXTRACTOR VACUUM M CUP 4 TUBE (SUCTIONS) IMPLANT
EXTRACTOR VACUUM M CUP 4' TUBE (SUCTIONS)
GLOVE BIO SURGEON STRL SZ 6.5 (GLOVE) ×2 IMPLANT
GLOVE BIO SURGEONS STRL SZ 6.5 (GLOVE) ×1
GLOVE BIOGEL PI IND STRL 7.0 (GLOVE) ×1 IMPLANT
GLOVE BIOGEL PI INDICATOR 7.0 (GLOVE) ×2
GOWN STRL REUS W/TWL LRG LVL3 (GOWN DISPOSABLE) ×6 IMPLANT
KIT ABG SYR 3ML LUER SLIP (SYRINGE) IMPLANT
LIQUID BAND (GAUZE/BANDAGES/DRESSINGS) ×3 IMPLANT
NEEDLE HYPO 25X5/8 SAFETYGLIDE (NEEDLE) IMPLANT
NS IRRIG 1000ML POUR BTL (IV SOLUTION) ×3 IMPLANT
PACK C SECTION WH (CUSTOM PROCEDURE TRAY) ×3 IMPLANT
PAD ABD 8X7 1/2 STERILE (GAUZE/BANDAGES/DRESSINGS) ×3 IMPLANT
PAD OB MATERNITY 4.3X12.25 (PERSONAL CARE ITEMS) ×3 IMPLANT
SPONGE GAUZE 4X4 12PLY STER LF (GAUZE/BANDAGES/DRESSINGS) ×3 IMPLANT
STRIP CLOSURE SKIN 1/2X4 (GAUZE/BANDAGES/DRESSINGS) ×2 IMPLANT
SUT CHROMIC 0 CT 802H (SUTURE) IMPLANT
SUT CHROMIC 0 CTX 36 (SUTURE) ×9 IMPLANT
SUT MON AB-0 CT1 36 (SUTURE) ×3 IMPLANT
SUT PDS AB 0 CTX 60 (SUTURE) ×3 IMPLANT
SUT PLAIN 0 NONE (SUTURE) IMPLANT
SUT PLAIN 2 0 XLH (SUTURE) ×3 IMPLANT
SUT VIC AB 4-0 KS 27 (SUTURE) IMPLANT
SYR BULB 3OZ (MISCELLANEOUS) ×3 IMPLANT
TAPE CLOTH SURG 4X10 WHT LF (GAUZE/BANDAGES/DRESSINGS) ×3 IMPLANT
TOWEL OR 17X24 6PK STRL BLUE (TOWEL DISPOSABLE) ×3 IMPLANT
TRAY FOLEY CATH SILVER 14FR (SET/KITS/TRAYS/PACK) IMPLANT

## 2015-04-16 NOTE — Anesthesia Preprocedure Evaluation (Addendum)
Anesthesia Evaluation  Patient identified by MRN, date of birth, ID band Patient awake    Reviewed: Allergy & Precautions, NPO status , Patient's Chart, lab work & pertinent test results  Airway Mallampati: I   Neck ROM: Full    Dental  (+) Dental Advisory Given, Teeth Intact   Pulmonary  breath sounds clear to auscultation        Cardiovascular negative cardio ROS  Rhythm:Regular     Neuro/Psych negative neurological ROS     GI/Hepatic negative GI ROS, Neg liver ROS,   Endo/Other  negative endocrine ROS  Renal/GU negative Renal ROS  negative genitourinary   Musculoskeletal   Abdominal (+)  Abdomen: soft.    Peds  Hematology   Anesthesia Other Findings   Reproductive/Obstetrics 37 week IUP Breech,  IAL                             Anesthesia Physical Anesthesia Plan  ASA: II and emergent  Anesthesia Plan: Spinal   Post-op Pain Management:    Induction:   Airway Management Planned: Natural Airway  Additional Equipment:   Intra-op Plan:   Post-operative Plan:   Informed Consent: I have reviewed the patients History and Physical, chart, labs and discussed the procedure including the risks, benefits and alternatives for the proposed anesthesia with the patient or authorized representative who has indicated his/her understanding and acceptance.     Plan Discussed with:   Anesthesia Plan Comments:         Anesthesia Quick Evaluation

## 2015-04-16 NOTE — Brief Op Note (Signed)
04/16/2015  4:53 PM  PATIENT:  Shelby Finley  34 y.o. female  PRE-OPERATIVE DIAGNOSIS:  BREECH  POST-OPERATIVE DIAGNOSIS:  BREECH  PROCEDURE:  Procedure(s) with comments: CESAREAN SECTION (N/A) - PRIMARY EDC 05/05/15 NKDA  SURGEON:  Surgeon(s) and Role:    * Zelphia Cairo, MD - Primary    * Harold Hedge, MD  PHYSICIAN ASSISTANT:   ASSISTANTS: none   ANESTHESIA:   spinal  EBL:  Total I/O In: 2400 [I.V.:2400] Out: 840 [Urine:40; Blood:800]  BLOOD ADMINISTERED:none  DRAINS: Urinary Catheter (Foley)   LOCAL MEDICATIONS USED:  NONE  SPECIMEN:  No Specimen  DISPOSITION OF SPECIMEN:  N/A  COUNTS:  YES  TOURNIQUET:  * No tourniquets in log *  DICTATION: .Other Dictation: Dictation Number [id number]  PLAN OF CARE: Admit to inpatient   PATIENT DISPOSITION:  PACU - hemodynamically stable.   Delay start of Pharmacological VTE agent (>24hrs) due to surgical blood loss or risk of bleeding: not applicable

## 2015-04-16 NOTE — Anesthesia Procedure Notes (Signed)
Spinal Patient location during procedure: OR Start time: 04/16/2015 3:55 PM End time: 04/16/2015 4:04 PM Staffing Anesthesiologist: Sebastian Ache Spinal Block Patient position: sitting Prep: site prepped and draped and DuraPrep Patient monitoring: heart rate, continuous pulse ox and blood pressure Approach: midline Location: L3-4 Injection technique: single-shot Needle Needle type: Pencil-Tip  Needle gauge: 24 G Needle length: 10 cm Needle insertion depth: 6 cm Additional Notes No complications

## 2015-04-16 NOTE — Anesthesia Postprocedure Evaluation (Signed)
  Anesthesia Post-op Note  Patient: Shelby Finley  Procedure(s) Performed: Procedure(s) with comments: CESAREAN SECTION (N/A) - PRIMARY EDC 05/05/15 NKDA  Patient Location: PACU  Anesthesia Type:General  Level of Consciousness: awake  Airway and Oxygen Therapy: Patient Spontanous Breathing  Post-op Pain: mild  Post-op Assessment: Post-op Vital signs reviewed, Patient's Cardiovascular Status Stable, Respiratory Function Stable, Patent Airway and No signs of Nausea or vomiting   LLE Sensation: Numbness   RLE Sensation: Numbness L Sensory Level: T10-Umbilical region R Sensory Level: T10-Umbilical region  Post-op Vital Signs: Reviewed and stable  Last Vitals:  Filed Vitals:   04/16/15 1715  BP: 149/68  Pulse: 93  Temp:   Resp: 35    Complications: No apparent anesthesia complications

## 2015-04-16 NOTE — MAU Note (Signed)
Patient presents at [redacted] weeks gestation with c/o contractions since 1220 today. Fetus active. Bloody show noted but no bleeding.

## 2015-04-16 NOTE — H&P (Signed)
Shelby Finley is a 34 y.o. female presenting for contractions. Known breech. No ROM, HA or vision change. Maternal Medical History:  Reason for admission: Contractions.   Fetal activity: Perceived fetal activity is normal.      OB History    Gravida Para Term Preterm AB TAB SAB Ectopic Multiple Living   2 0   1 1    0     Past Medical History  Diagnosis Date  . Anemia    Past Surgical History  Procedure Laterality Date  . Wisdom tooth extraction     Family History: family history includes Heart disease in her father and paternal grandmother; Hypertension in her father, maternal grandfather, maternal grandmother, and paternal grandmother; Stroke in her maternal grandmother; Thyroid disease in her mother. Social History:  reports that she has never smoked. She does not have any smokeless tobacco history on file. She reports that she drinks alcohol. She reports that she does not use illicit drugs.   Prenatal Transfer Tool  Maternal Diabetes: No Genetic Screening: Normal Maternal Ultrasounds/Referrals: Normal Fetal Ultrasounds or other Referrals:  None Maternal Substance Abuse:  No Significant Maternal Medications:  None Significant Maternal Lab Results:  None Other Comments:  None  Review of Systems  Eyes: Negative for blurred vision.  Gastrointestinal: Negative for abdominal pain.  Neurological: Negative for headaches.    Dilation: 4 Effacement (%):  (95) Station:  (membranes bulging) Exam by:: Shelby Burns, RN BSN Blood pressure 136/77, pulse 96, temperature 98.5 F (36.9 C), temperature source Oral, resp. rate 16, height 5' 1.5" (1.562 m), weight 195 lb (88.451 kg), last menstrual period 07/29/2014. Maternal Exam:  Uterine Assessment: Contraction strength is moderate.  Contraction frequency is irregular.   Abdomen: Patient reports no abdominal tenderness. Fetal presentation: breech     Fetal Exam Fetal State Assessment: Category I - tracings are  normal.     Physical Exam  Cardiovascular: Normal rate and regular rhythm.   Respiratory: Effort normal and breath sounds normal.  GI: Soft. Bowel sounds are normal.  Neurological: She has normal reflexes.    Bedside U/S = breech Prenatal labs: ABO, Rh:   Antibody:   Rubella:   RPR:    HBsAg:    HIV:    GBS:     Assessment/Plan: 34 yo G2P0 @ 37 2/7 weeks with breech in labor Recommend cesarean section-D/W patient and husband surgery. D/W risks including infection, organ damage, bleeding/transfusion-HIV/Hep, DVT/PE, pneumonia, return to OR. All questions answered. Patient states she understands and agrees.   Shelby Finley,Shelby Finley 04/16/2015, 3:32 PM

## 2015-04-16 NOTE — Transfer of Care (Signed)
Immediate Anesthesia Transfer of Care Note  Patient: Shelby Finley  Procedure(s) Performed: Procedure(s) with comments: CESAREAN SECTION (N/A) - PRIMARY EDC 05/05/15 NKDA  Patient Location: PACU  Anesthesia Type:Spinal  Level of Consciousness: awake, alert  and oriented  Airway & Oxygen Therapy: Patient Spontanous Breathing  Post-op Assessment: Report given to RN and Post -op Vital signs reviewed and stable  Post vital signs: Reviewed and stable  Last Vitals:  Filed Vitals:   04/16/15 1429  BP: 136/77  Pulse: 96  Temp: 36.9 C  Resp: 16    Complications: No apparent anesthesia complications

## 2015-04-17 ENCOUNTER — Encounter (HOSPITAL_COMMUNITY): Payer: Self-pay | Admitting: Obstetrics and Gynecology

## 2015-04-17 LAB — CBC
HEMATOCRIT: 29.1 % — AB (ref 36.0–46.0)
HEMOGLOBIN: 9.5 g/dL — AB (ref 12.0–15.0)
MCH: 29.4 pg (ref 26.0–34.0)
MCHC: 32.6 g/dL (ref 30.0–36.0)
MCV: 90.1 fL (ref 78.0–100.0)
PLATELETS: 204 10*3/uL (ref 150–400)
RBC: 3.23 MIL/uL — AB (ref 3.87–5.11)
RDW: 13.7 % (ref 11.5–15.5)
WBC: 13.8 10*3/uL — AB (ref 4.0–10.5)

## 2015-04-17 LAB — RPR: RPR: NONREACTIVE

## 2015-04-17 LAB — BIRTH TISSUE RECOVERY COLLECTION (PLACENTA DONATION)

## 2015-04-17 NOTE — Progress Notes (Signed)
Subjective: Postpartum Day 1: Cesarean Delivery Patient reports tolerating PO.    Objective: Vital signs in last 24 hours: Temp:  [97.7 F (36.5 C)-99 F (37.2 C)] 98.8 F (37.1 C) (08/17 0530) Pulse Rate:  [67-102] 71 (08/17 0530) Resp:  [15-35] 18 (08/17 0530) BP: (108-154)/(54-90) 108/54 mmHg (08/17 0530) SpO2:  [96 %-100 %] 97 % (08/17 0530) Weight:  [195 lb (88.451 kg)] 195 lb (88.451 kg) (08/16 1429)  Physical Exam:  General: alert and cooperative Lochia: appropriate Uterine Fundus: firm Incision: small old drainage noted on honeycomb dressing DVT Evaluation: No evidence of DVT seen on physical exam. Negative Homan's sign. No cords or calf tenderness. No significant calf/ankle edema.   Recent Labs  04/16/15 1509 04/17/15 0540  HGB 12.3 9.5*  HCT 37.2 29.1*    Assessment/Plan: Status post Cesarean section. Doing well postoperatively.  Continue current care.  Aniqa Hare G 04/17/2015, 8:03 AM

## 2015-04-17 NOTE — Op Note (Signed)
NAMEDELLANIRA, DILLOW NO.:  [id number]  MEDICAL RECORD NO.:  [id number]  LOCATION:  9134                          FACILITY:  [facility]  PHYSICIAN:  Guy Sandifer. Henderson Cloud, M.D. DATE OF BIRTH:  07/17/1981  DATE OF PROCEDURE:  04/16/2015 DATE OF DISCHARGE:                              OPERATIVE REPORT   PREOPERATIVE DIAGNOSES: 1. Intrauterine pregnancy, 37-2/7 weeks. 2. Breech. 3. Labor.  POSTOPERATIVE DIAGNOSES: 1. Intrauterine pregnancy, 37-2/7 weeks. 2. Breech. 3. Labor.  PROCEDURE:  Low-transverse cesarean section.  SURGEON:  Guy Sandifer. Henderson Cloud, MD  ANESTHESIA:  Spinal, Sebastian Ache, MD  ESTIMATED BLOOD LOSS:  600 mL.  FINDINGS:  Viable female infant, Apgars 9 and 9.  Arterial cord pH and weight pending.  SPECIMENS:  None.  INDICATIONS AND CONSENT:  This patient is a 34 year old, G2, P0, at 44- 2/7 weeks who presents to MAU with uterine contractions.  Cervix was 4, thin with a bulging bag of waters.  Bedside ultrasound confirms breech position.  Cesarean section is recommended.  Potential risks and complications reviewed preoperatively including, but not limited to, infection, organ damage, bleeding requiring transfusion of blood products with HIV and hepatitis acquisition, DVT, PE, pneumonia.  All questions were answered and consent was signed on the chart.  DESCRIPTION OF PROCEDURE:  The patient was taken to the operating room, where she was identified, spinal anesthetic was placed per Dr. Berneice Heinrich and she was placed in a dorsal supine position with a 15-degree left lateral wedge.  Foley catheter was placed and she was prepped vaginally and abdominally per [facility] protocol.  She was draped in a sterile fashion.  Time-out was undertaken.  After testing for adequate spinal anesthesia, skin was entered through a Pfannenstiel incision and dissection was carried out in layers to the peritoneum.  Peritoneum was sharply entered and extended superiorly  and inferiorly.  Vesicouterine peritoneum was taken down cephalolaterally.  Bladder flap developed and bladder blade placed.  Uterus was incised in a low transverse manner. The uterine cavity was entered bluntly with a hemostat.  Uterine incision was extended cephalolaterally with the fingers.  Clear fluid was noted.  Baby was delivered from the frank breech position without difficulty.  Good cry and tone was noted.  Cord was clamped and cut. The baby was handed to awaiting pediatrics team.  Placenta was manually delivered.  Uterine cavity was clean.  Uterus was closed in 2 running locking imbricating layers of 0 Monocryl suture which achieves good hemostasis.  Anterior peritoneum was closed in running fashion with 0 Monocryl suture, which was also used to reapproximate the pyramidalis muscle in the midline.  Anterior rectus fascia was closed in a running fashion with a 0 looped PDS.  Subcutaneous tissue was closed with interrupted plain and the skin was closed with a subcuticular Vicryl on a Keith needle.  Steri-Strips, pressure dressing was applied.  All counts were correct.  The patient was taken to recovery room in stable condition.     Guy Sandifer Henderson Cloud, M.D.     JET/MEDQ  D:  04/16/2015  T:  04/17/2015  Job:  [id number]

## 2015-04-17 NOTE — Anesthesia Postprocedure Evaluation (Signed)
  Anesthesia Post-op Note  Patient: Shelby Finley  Procedure(s) Performed: Procedure(s) with comments: CESAREAN SECTION (N/A) - PRIMARY EDC 05/05/15 NKDA  Patient Location: Mother/Baby  Anesthesia Type:Spinal  Level of Consciousness: awake, alert  and oriented  Airway and Oxygen Therapy: Patient Spontanous Breathing  Post-op Pain: none  Post-op Assessment: Post-op Vital signs reviewed, Patient's Cardiovascular Status Stable, PATIENT'S CARDIOVASCULAR STATUS UNSTABLE, Patent Airway, No signs of Nausea or vomiting, Adequate PO intake, Pain level controlled, No headache and No backache    Post-op Vital Signs: Reviewed and stable  Last Vitals:  Filed Vitals:   04/17/15 0530  BP: 108/54  Pulse: 71  Temp: 37.1 C  Resp: 18    Complications: No apparent anesthesia complications

## 2015-04-17 NOTE — Addendum Note (Signed)
Addendum  created 04/17/15 0931 by Shanon Payor, CRNA   Modules edited: Notes Section   Notes Section:  File: [id number]

## 2015-04-18 NOTE — Progress Notes (Signed)
Subjective: Postpartum Day 2: Cesarean Delivery Patient reports tolerating PO, + flatus and no problems voiding.    Objective: Vital signs in last 24 hours: Temp:  [97.8 F (36.6 C)-98.9 F (37.2 C)] 98.4 F (36.9 C) (08/18 0558) Pulse Rate:  [75-88] 75 (08/18 0558) Resp:  [16-20] 20 (08/18 0558) BP: (117-121)/(61-76) 120/66 mmHg (08/18 0558) SpO2:  [96 %-98 %] 97 % (08/17 1630)  Physical Exam:  General: alert and cooperative Lochia: appropriate Uterine Fundus: firm Incision: healing well DVT Evaluation: No evidence of DVT seen on physical exam. Negative Homan's sign. No cords or calf tenderness. No significant calf/ankle edema.   Recent Labs  04/16/15 1509 04/17/15 0540  HGB 12.3 9.5*  HCT 37.2 29.1*    Assessment/Plan: Status post Cesarean section. Doing well postoperatively.  Continue current care.  CURTIS,CAROL G 04/18/2015, 8:30 AM

## 2015-04-18 NOTE — Lactation Note (Signed)
This note was copied from the chart of Shelby Kaleesi [place]. Lactation Consultation Note Follow up visit at 50 hours of age.  Mom reports increases feedings and nipple soreness.  Baby has had 10 feedings with adequate output in past 24 hours.  Nipples appear WNL, mom has comfort gels but not in use today. Encouraged mom to work on hand expression pre and post feedings and rub EBM into nipples. Discussed positioning and bfing basics.  Mom to call for assist as needed.      Patient Name: Shelby Finley [id number] Date: 04/18/2015 Reason for consult: Follow-up assessment   Maternal Data Has patient been taught Hand Expression?: Yes  Feeding Feeding Type: Breast Fed Length of feed: 5 min  LATCH Score/Interventions Latch: Grasps breast easily, tongue down, lips flanged, rhythmical sucking. Intervention(s): Skin to skin;Teach feeding cues;Waking techniques  Audible Swallowing: A few with stimulation  Type of Nipple: Everted at rest and after stimulation  Comfort (Breast/Nipple): Filling, red/small blisters or bruises, mild/mod discomfort  Problem noted: Mild/Moderate discomfort  Hold (Positioning): No assistance needed to correctly position infant at breast. Intervention(s): Breastfeeding basics reviewed;Support Pillows;Position options;Skin to skin  LATCH Score: 8  Lactation Tools Discussed/Used     Consult Status Consult Status: Follow-up Date: 04/19/15 Follow-up type: In-patient    Jannifer Rodney 04/18/2015, 6:25 PM

## 2015-04-19 ENCOUNTER — Ambulatory Visit: Payer: Self-pay

## 2015-04-19 DIAGNOSIS — Z3490 Encounter for supervision of normal pregnancy, unspecified, unspecified trimester: Secondary | ICD-10-CM

## 2015-04-19 MED ORDER — IBUPROFEN 600 MG PO TABS: 600.0000 mg | ORAL_TABLET | Freq: Four times a day (QID) | ORAL | Status: AC

## 2015-04-19 NOTE — Discharge Summary (Signed)
Obstetric Discharge Summary Reason for Admission: onset of labor Prenatal Procedures: ultrasound Intrapartum Procedures: cesarean: low cervical, transverse Postpartum Procedures: none Complications-Operative and Postpartum: none HEMOGLOBIN  Date Value Ref Range Status  04/17/2015 9.5* 12.0 - 15.0 g/dL Final    Comment:    REPEATED TO VERIFY DELTA CHECK NOTED   01/17/2013 12.8 12.2 - 16.2 g/dL Final   HCT  Date Value Ref Range Status  04/17/2015 29.1* 36.0 - 46.0 % Final   HCT, POC  Date Value Ref Range Status  01/17/2013 41.1 37.7 - 47.9 % Final    Physical Exam:  General: alert and cooperative Lochia: appropriate Uterine Fundus: firm Incision: healing well DVT Evaluation: No evidence of DVT seen on physical exam. Negative Homan's sign. No cords or calf tenderness. Calf/Ankle edema is present.  Discharge Diagnoses: Term Pregnancy-delivered  Discharge Information: Date: 04/19/2015 Activity: pelvic rest Diet: routine Medications: PNV, Ibuprofen and Percocet Condition: stable Instructions: refer to practice specific booklet Discharge to: home   Newborn Data: Live born female  Birth Weight: 6 lb 15.5 oz (3161 g) APGAR: 9, 9  Home with mother.  Paola Aleshire G 04/19/2015, 8:26 AM

## 2015-04-19 NOTE — Lactation Note (Signed)
This note was copied from the chart of Shelby Finley. Lactation Consultation Note  Patient Name: Shelby Finley [id number] Date: 04/19/2015 Reason for consult: Follow-up assessment Mom reports baby is nursing well, denies questions/concerns. Engorgement care reviewed if needed. Advised of OP services and support group.   Maternal Data    Feeding    LATCH Score/Interventions                      Lactation Tools Discussed/Used     Consult Status Consult Status: Complete Date: 04/19/15 Follow-up type: In-patient    Shelby Finley 04/19/2015, 1:23 PM

## 2015-05-01 ENCOUNTER — Inpatient Hospital Stay (HOSPITAL_COMMUNITY): Admit: 2015-05-01 | Payer: Self-pay | Admitting: Obstetrics and Gynecology

## 2019-05-31 ENCOUNTER — Other Ambulatory Visit: Payer: Self-pay

## 2019-05-31 DIAGNOSIS — Z20822 Contact with and (suspected) exposure to covid-19: Secondary | ICD-10-CM

## 2019-06-01 LAB — NOVEL CORONAVIRUS, NAA: SARS-CoV-2, NAA: NOT DETECTED
